# Patient Record
Sex: Male | Born: 2000 | Race: Black or African American | Hispanic: No | Marital: Single | Smoking: Never smoker
Health system: Southern US, Community
[De-identification: ages and names within clinical notes are randomized; demographics above are authoritative.]

## PROBLEM LIST (undated history)

## (undated) HISTORY — PX: NO PAST SURGERIES: SHX2092

---
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
## 2021-01-18 ENCOUNTER — Other Ambulatory Visit: Payer: Self-pay

## 2021-05-05 IMAGING — CR DG KNEE COMPLETE 4+V*L*
4 series · 4 of 4 positions shown · non-contrast
Comparison: Left knee radiographs 09/29/2017

CLINICAL DATA: Pt reports left knee pain from a fall x 1 day ago.
Pt reports hard to bear weight, bend his knee, and most pain on
medial epicondyle. pt reports previous ligament tear to left knee 2
years ago.

EXAM:
LEFT KNEE - COMPLETE 4+ VIEW

[knee ap]
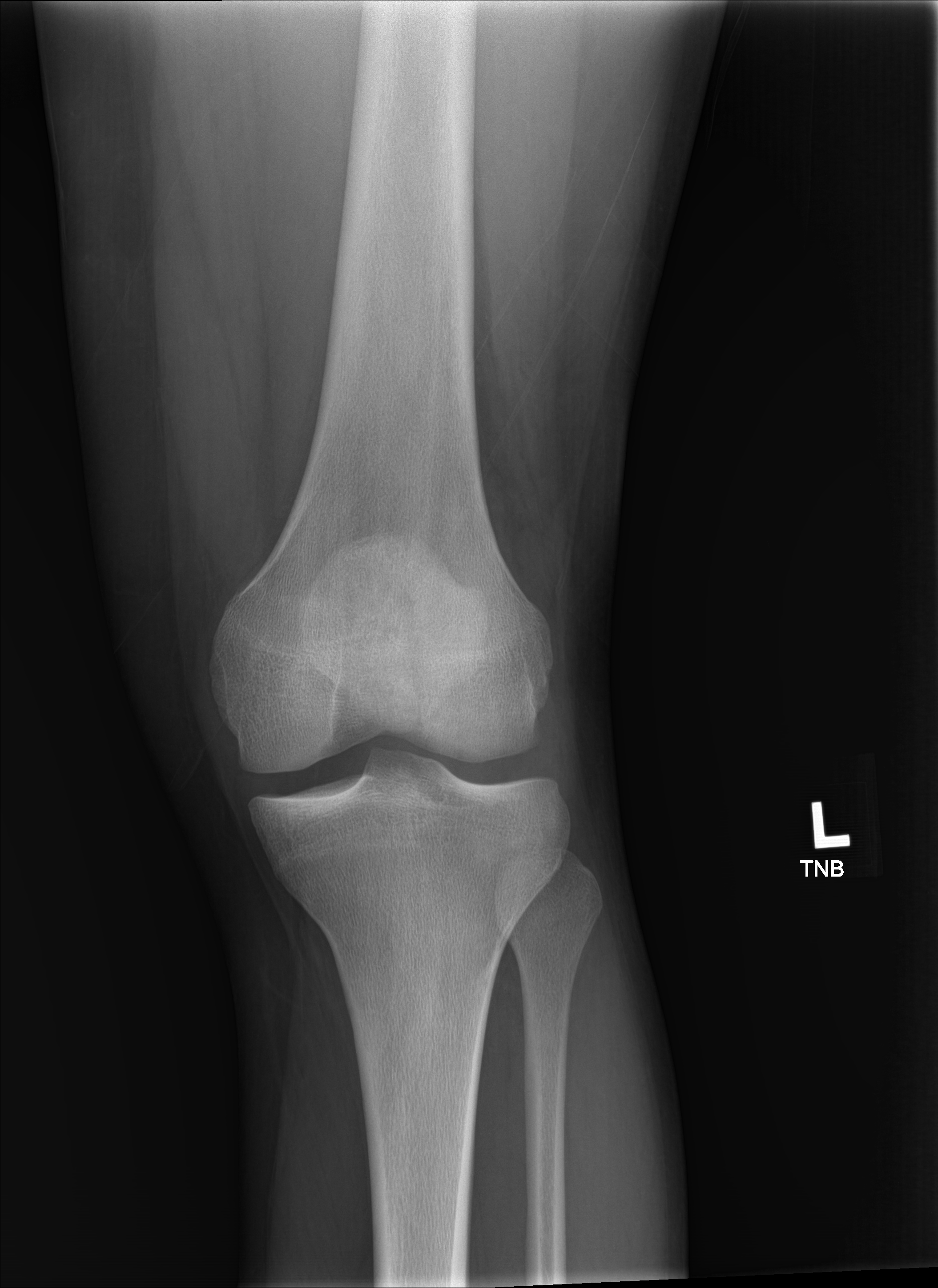

[knee lat]
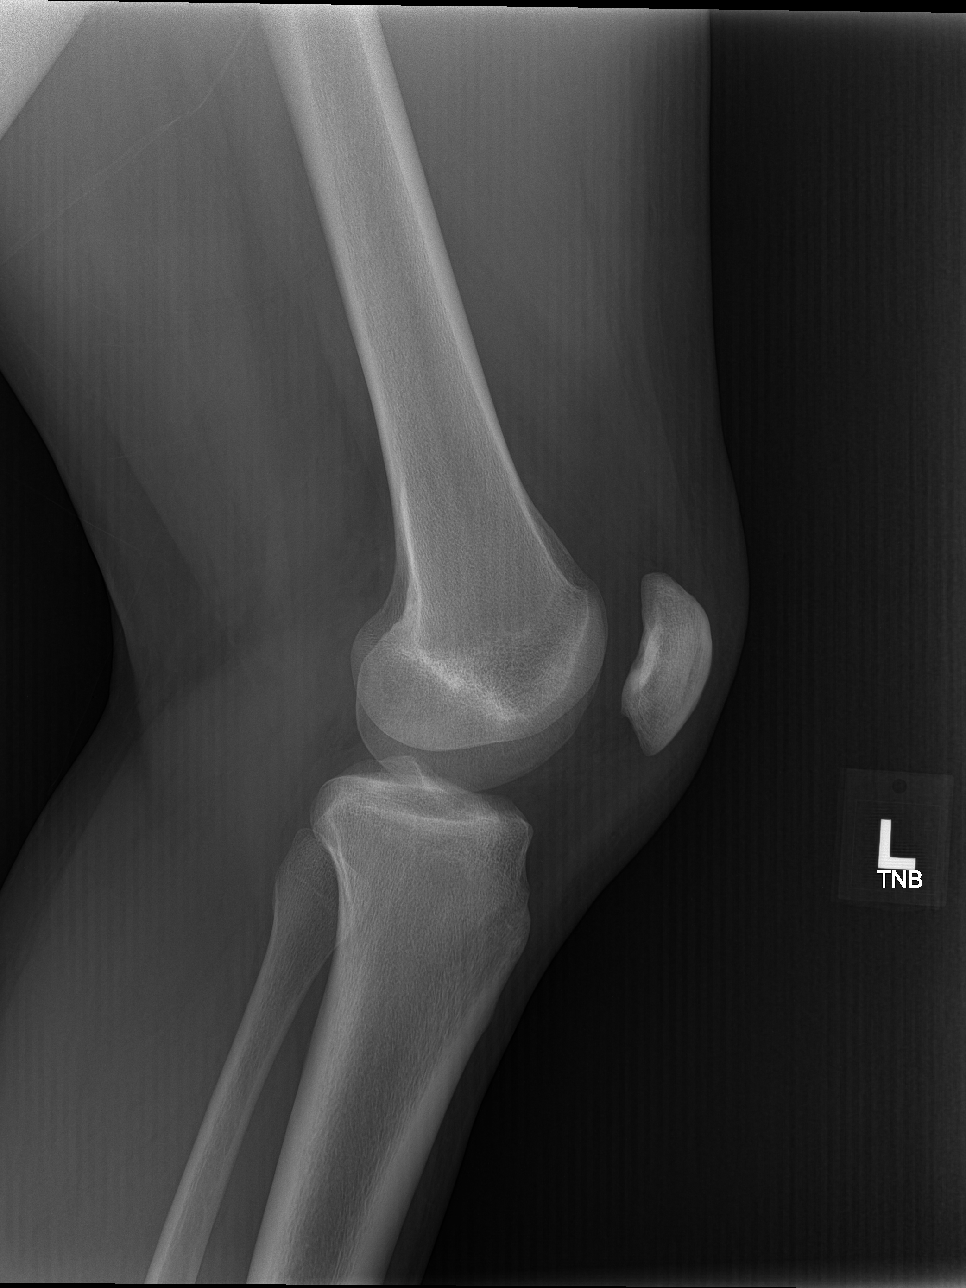

[knee obl (1 of 2)]
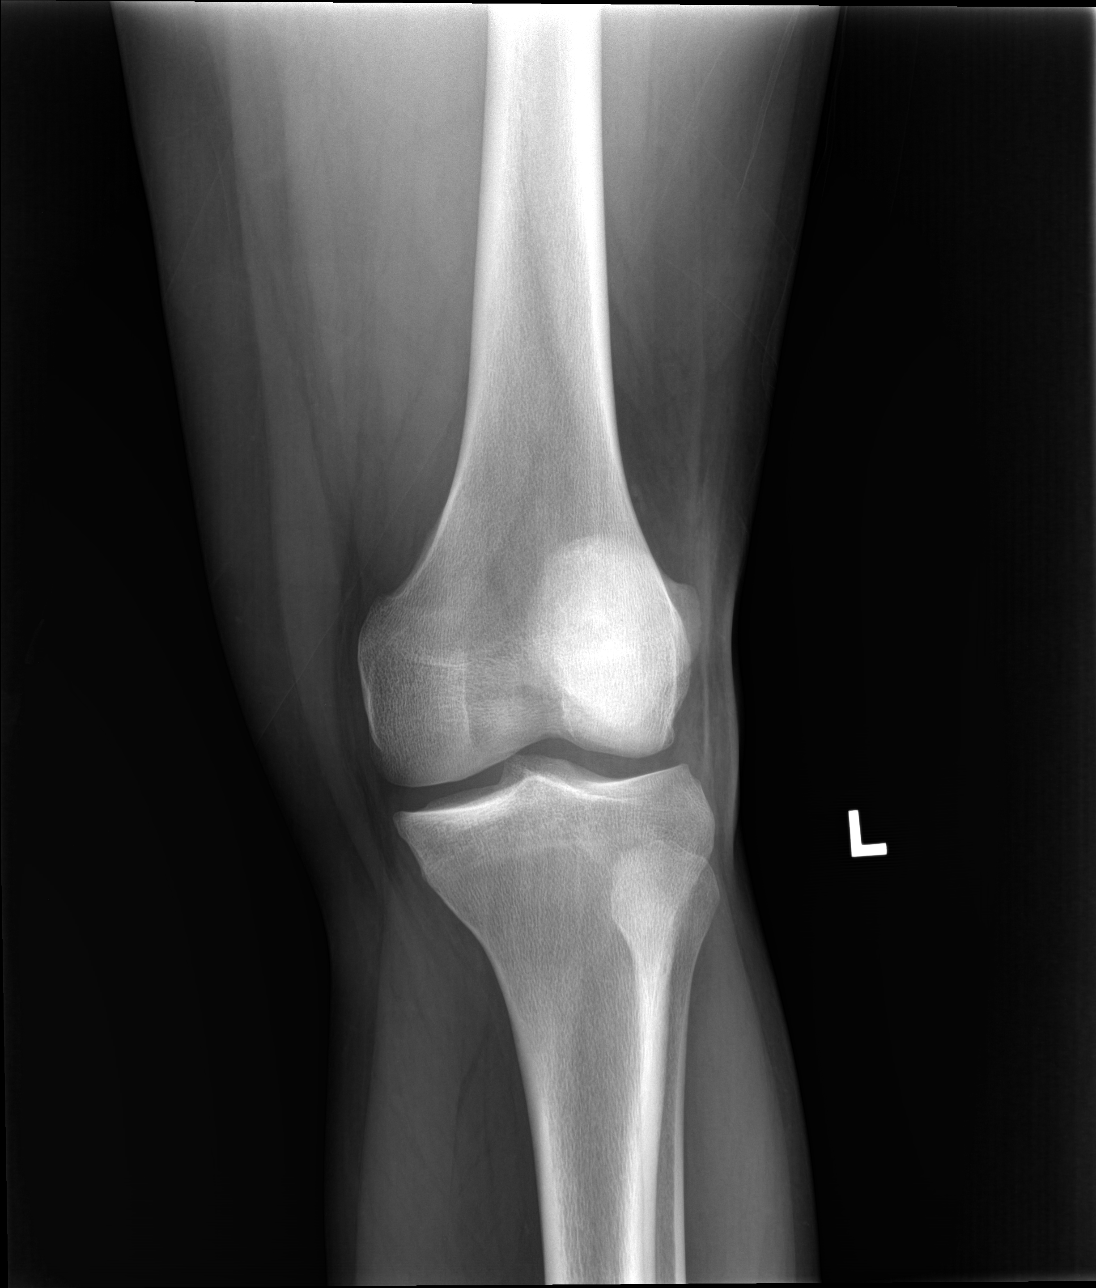

[knee obl (2 of 2)]
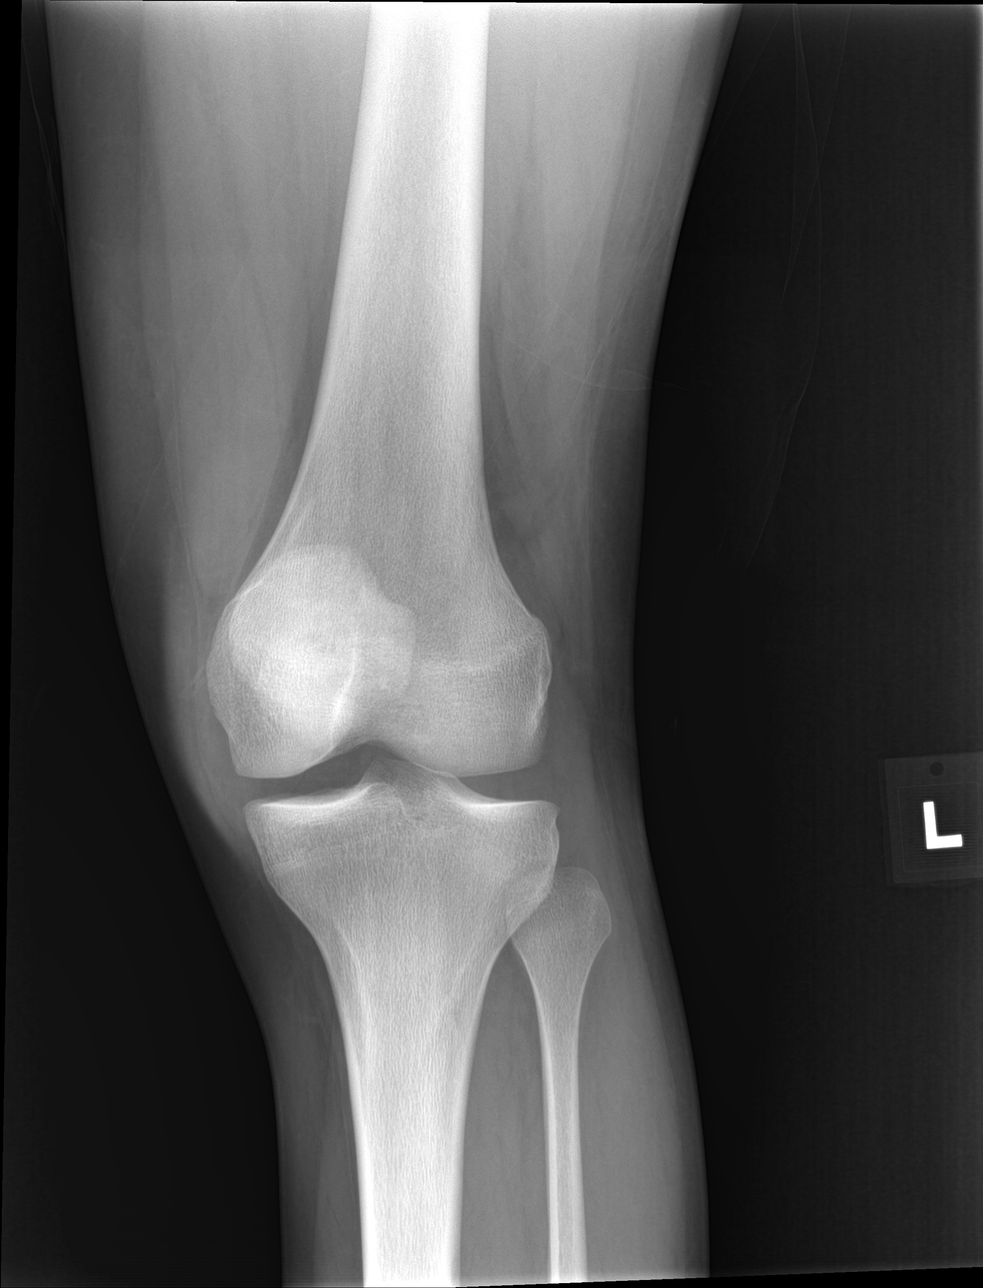

[4 of 4 positions shown; findings below may reference images not displayed]

FINDINGS: No evidence of fracture, dislocation, or joint effusion. No evidence
of arthropathy or other focal bone abnormality. Soft tissues are
unremarkable.
IMPRESSION: Negative.

## 2021-05-22 IMAGING — CR DG KNEE COMPLETE 4+V*L*
1 series · 4 of 4 positions shown · non-contrast
Comparison: 09/03/2019

CLINICAL DATA: Anterior left knee pain, football injury

EXAM:
LEFT KNEE - COMPLETE 4+ VIEW

[Series 1: dg knee complete 4 views left · 0.14mm/px · 4 of 4 slices shown]
[im 1/4]
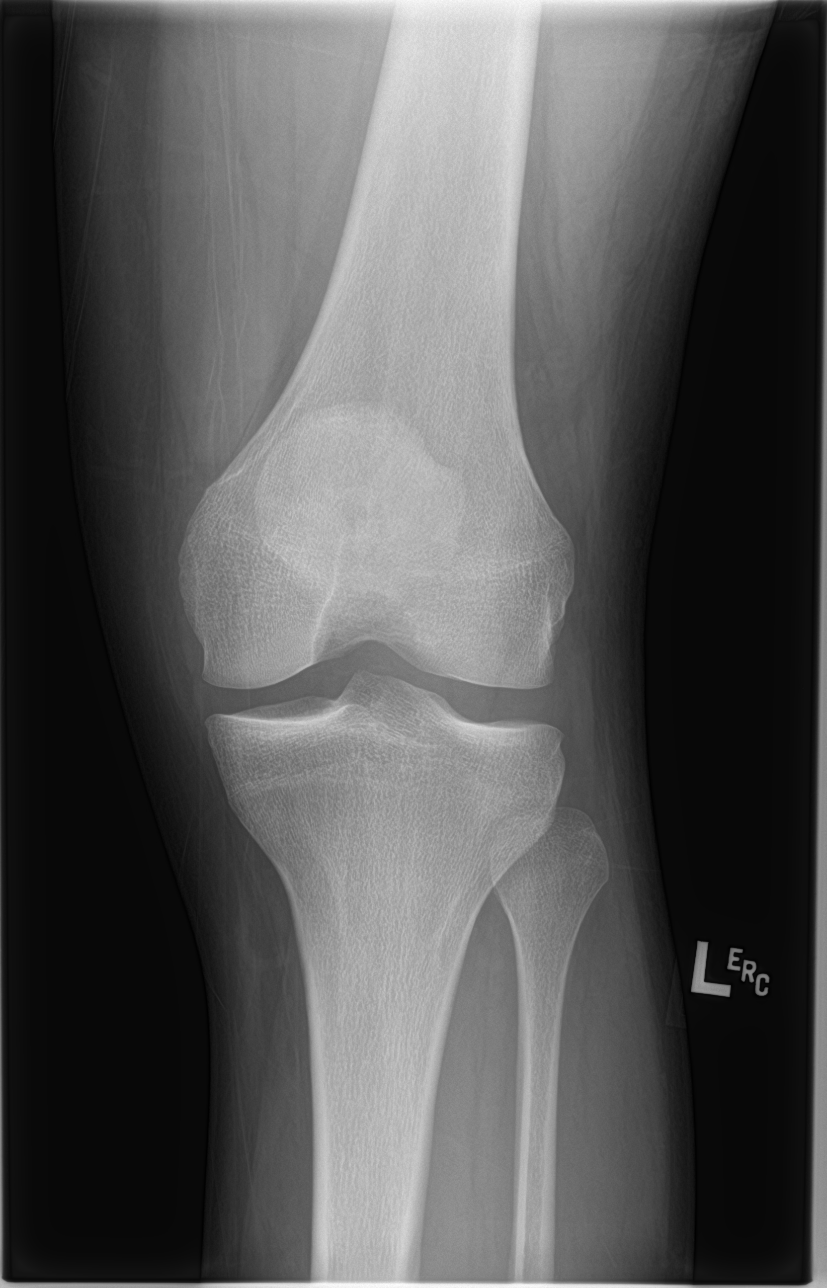
[im 2/4]
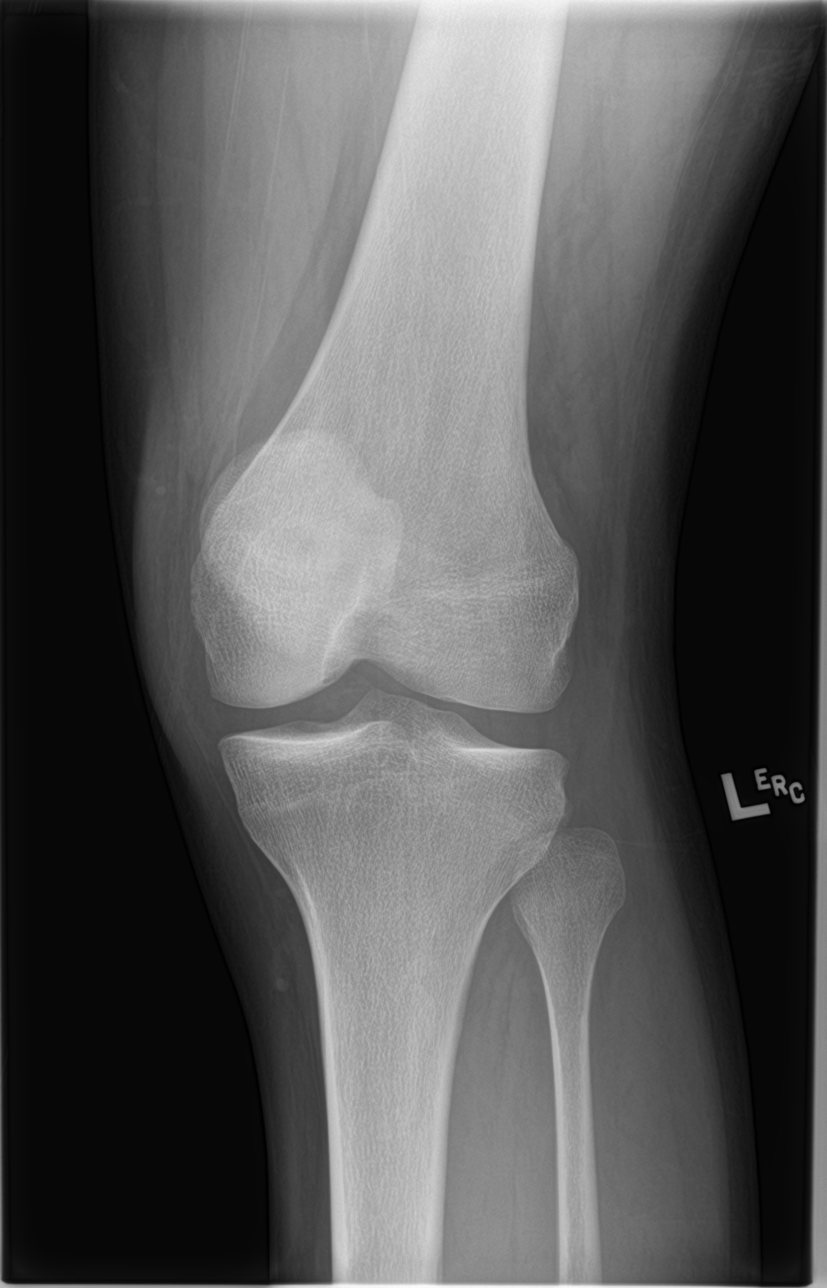
[im 3/4]
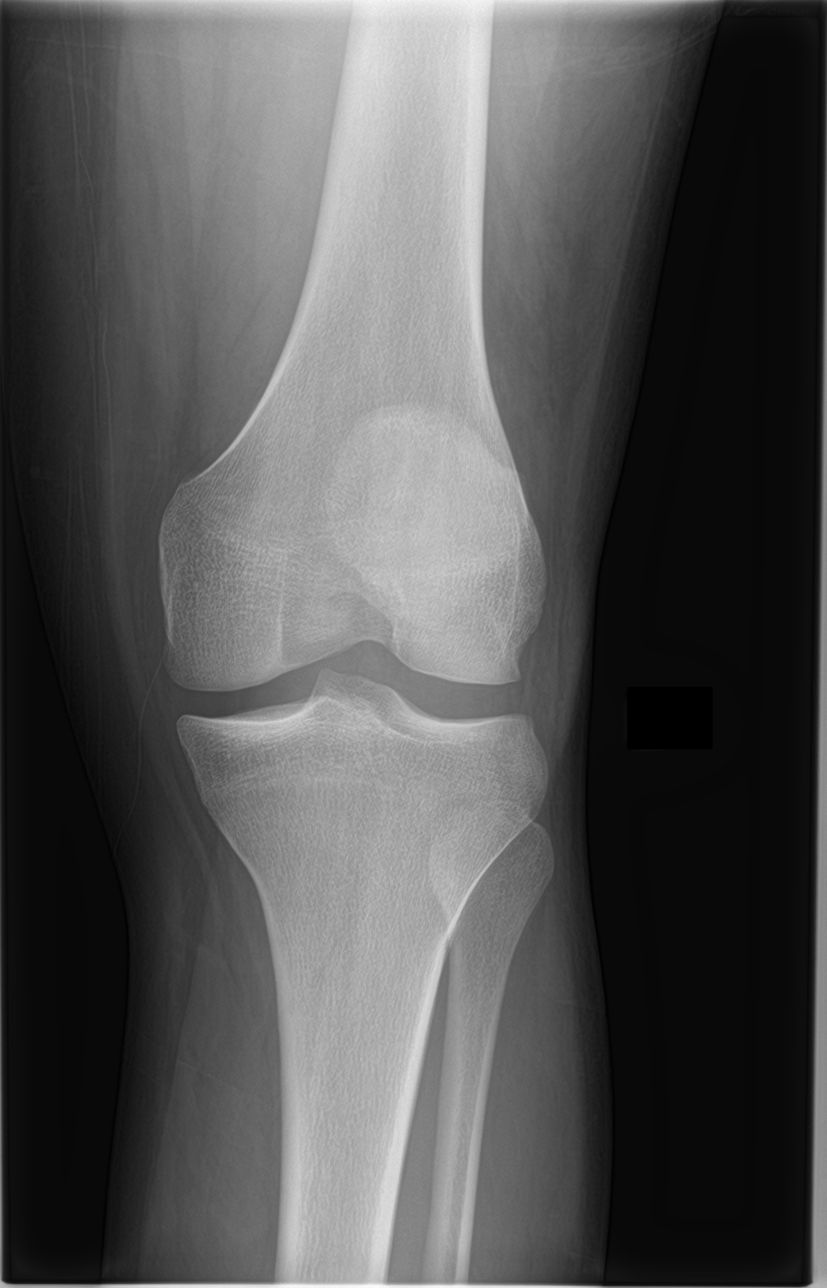
[im 4/4]
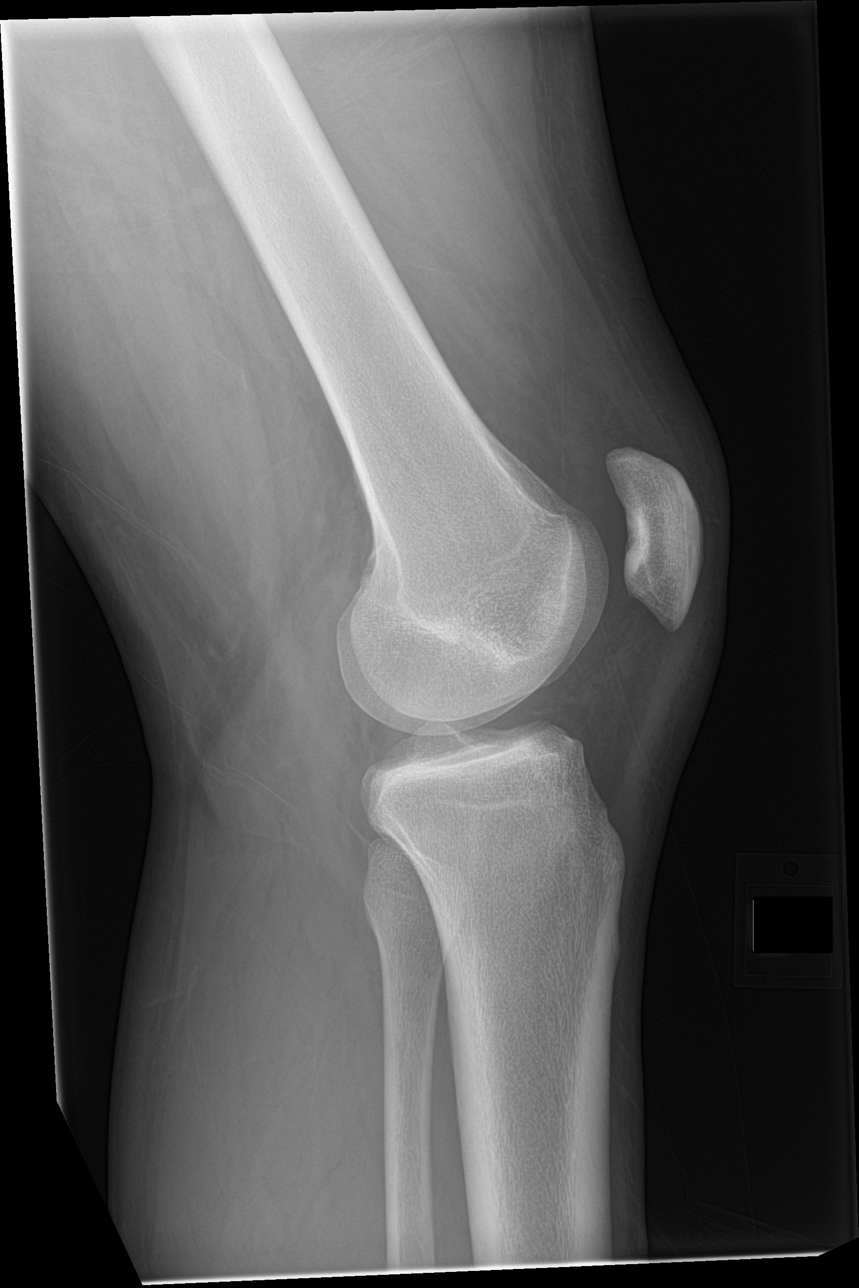

[4 of 4 positions shown; findings below may reference images not displayed]

FINDINGS: Frontal, bilateral oblique, lateral views of the left knee
demonstrate no fractures. Alignment is anatomic. Joint spaces are
well preserved. No joint effusion.
IMPRESSION: 1. Unremarkable left knee.
